# Patient Record
Sex: Female | Born: 1975 | Race: Black or African American | Hispanic: No | Marital: Single | Smoking: Former smoker
Health system: Southern US, Community
[De-identification: ages and names within clinical notes are randomized; demographics above are authoritative.]

## PROBLEM LIST (undated history)

## (undated) DIAGNOSIS — E785 Hyperlipidemia, unspecified: Secondary | ICD-10-CM

## (undated) DIAGNOSIS — E559 Vitamin D deficiency, unspecified: Secondary | ICD-10-CM

## (undated) DIAGNOSIS — I10 Essential (primary) hypertension: Secondary | ICD-10-CM

## (undated) DIAGNOSIS — R51 Headache: Secondary | ICD-10-CM

## (undated) DIAGNOSIS — E119 Type 2 diabetes mellitus without complications: Secondary | ICD-10-CM

## (undated) DIAGNOSIS — G4730 Sleep apnea, unspecified: Secondary | ICD-10-CM

## (undated) DIAGNOSIS — E282 Polycystic ovarian syndrome: Secondary | ICD-10-CM

## (undated) DIAGNOSIS — R519 Headache, unspecified: Secondary | ICD-10-CM

## (undated) HISTORY — DX: Sleep apnea, unspecified: G47.30

## (undated) HISTORY — DX: Headache: R51

## (undated) HISTORY — DX: Type 2 diabetes mellitus without complications: E11.9

## (undated) HISTORY — DX: Headache, unspecified: R51.9

## (undated) HISTORY — PX: WISDOM TOOTH EXTRACTION: SHX21

## (undated) HISTORY — DX: Hyperlipidemia, unspecified: E78.5

## (undated) HISTORY — PX: DILATION AND CURETTAGE OF UTERUS: SHX78

---

## 1999-10-15 ENCOUNTER — Emergency Department (HOSPITAL_COMMUNITY): Admission: EM | Admit: 1999-10-15 | Discharge: 1999-10-15 | Payer: Self-pay | Admitting: Emergency Medicine

## 1999-10-15 ENCOUNTER — Encounter: Payer: Self-pay | Admitting: Emergency Medicine

## 2002-08-02 ENCOUNTER — Encounter: Payer: Self-pay | Admitting: Emergency Medicine

## 2002-08-02 ENCOUNTER — Emergency Department (HOSPITAL_COMMUNITY): Admission: EM | Admit: 2002-08-02 | Discharge: 2002-08-02 | Payer: Self-pay | Admitting: Emergency Medicine

## 2002-12-30 ENCOUNTER — Other Ambulatory Visit: Admission: RE | Admit: 2002-12-30 | Discharge: 2002-12-30 | Payer: Self-pay | Admitting: Obstetrics and Gynecology

## 2004-03-22 ENCOUNTER — Emergency Department (HOSPITAL_COMMUNITY): Admission: EM | Admit: 2004-03-22 | Discharge: 2004-03-23 | Payer: Self-pay | Admitting: Emergency Medicine

## 2004-05-16 ENCOUNTER — Emergency Department (HOSPITAL_COMMUNITY): Admission: EM | Admit: 2004-05-16 | Discharge: 2004-05-16 | Payer: Self-pay | Admitting: Emergency Medicine

## 2004-05-17 ENCOUNTER — Ambulatory Visit (HOSPITAL_COMMUNITY): Admission: RE | Admit: 2004-05-17 | Discharge: 2004-05-17 | Payer: Self-pay | Admitting: Emergency Medicine

## 2005-01-15 ENCOUNTER — Ambulatory Visit (HOSPITAL_COMMUNITY): Admission: RE | Admit: 2005-01-15 | Discharge: 2005-01-15 | Payer: Self-pay | Admitting: Gastroenterology

## 2005-04-09 ENCOUNTER — Other Ambulatory Visit: Admission: RE | Admit: 2005-04-09 | Discharge: 2005-04-09 | Payer: Self-pay | Admitting: Family Medicine

## 2006-11-13 ENCOUNTER — Other Ambulatory Visit: Admission: RE | Admit: 2006-11-13 | Discharge: 2006-11-13 | Payer: Self-pay | Admitting: Family Medicine

## 2007-08-16 ENCOUNTER — Emergency Department (HOSPITAL_COMMUNITY): Admission: EM | Admit: 2007-08-16 | Discharge: 2007-08-16 | Payer: Self-pay | Admitting: Emergency Medicine

## 2011-04-11 ENCOUNTER — Ambulatory Visit (INDEPENDENT_AMBULATORY_CARE_PROVIDER_SITE_OTHER): Payer: BC Managed Care – PPO | Admitting: Family Medicine

## 2011-04-11 VITALS — BP 167/104 | HR 109 | Temp 98.2°F | Resp 16 | Ht 66.0 in | Wt 186.0 lb

## 2011-04-11 DIAGNOSIS — R202 Paresthesia of skin: Secondary | ICD-10-CM

## 2011-04-11 DIAGNOSIS — R000 Tachycardia, unspecified: Secondary | ICD-10-CM

## 2011-04-11 DIAGNOSIS — G252 Other specified forms of tremor: Secondary | ICD-10-CM

## 2011-04-11 DIAGNOSIS — R209 Unspecified disturbances of skin sensation: Secondary | ICD-10-CM

## 2011-04-11 DIAGNOSIS — R200 Anesthesia of skin: Secondary | ICD-10-CM

## 2011-04-11 DIAGNOSIS — R631 Polydipsia: Secondary | ICD-10-CM

## 2011-04-11 DIAGNOSIS — Z131 Encounter for screening for diabetes mellitus: Secondary | ICD-10-CM

## 2011-04-11 DIAGNOSIS — R259 Unspecified abnormal involuntary movements: Secondary | ICD-10-CM

## 2011-04-11 DIAGNOSIS — I10 Essential (primary) hypertension: Secondary | ICD-10-CM

## 2011-04-11 LAB — POCT CBC
Granulocyte percent: 64.2 %G (ref 37–80)
HCT, POC: 39 % (ref 37.7–47.9)
Hemoglobin: 12 g/dL — AB (ref 12.2–16.2)
Lymph, poc: 3 (ref 0.6–3.4)
MCH, POC: 25.9 pg — AB (ref 27–31.2)
MCHC: 30.8 g/dL — AB (ref 31.8–35.4)
MCV: 84.1 fL (ref 80–97)
MID (cbc): 0.5 (ref 0–0.9)
MPV: 8.9 fL (ref 0–99.8)
POC Granulocyte: 6.3 (ref 2–6.9)
POC LYMPH PERCENT: 30.4 %L (ref 10–50)
POC MID %: 5.4 %M (ref 0–12)
Platelet Count, POC: 432 10*3/uL — AB (ref 142–424)
RBC: 4.64 M/uL (ref 4.04–5.48)
RDW, POC: 14.7 %
WBC: 9.8 10*3/uL (ref 4.6–10.2)

## 2011-04-11 LAB — POCT GLYCOSYLATED HEMOGLOBIN (HGB A1C): Hemoglobin A1C: 5.3

## 2011-04-11 MED ORDER — HYDROCHLOROTHIAZIDE 25 MG PO TABS: 12.5000 mg | ORAL_TABLET | Freq: Every day | ORAL | Status: DC

## 2011-04-11 NOTE — Progress Notes (Signed)
[facility]:  Office Visit  Chief Complaint:  Chief Complaint  Patient presents with  . Night Sweats    x 3 weeks  . Tremors    since last night  . Numbness    R side of body  . Hypertension    HPI: Linda Glenn is a 36 y.o. female who complains of  3 week hx of numbness and tingling in right arm and leg, intermittent episodes lasting minutes to hours, worse at night and first thing in the morning, however in last 2-3 days more persistent. She has been lightheaded and nauseated for 1 week. She has had tremors and sweaty chills.  LMP 04/06/11. No CP, palpitations, SOB, vision changes, unintentional weight loss except for chalazion on left eye. She has a h/o migraines. Patient denies any illicit drug use except for recreational marijuana, no stimulants for weight loss or energy drinks. She does consume caffeine but not in large amount. Denies any recent travels or sick contacts or immunosupression or a history of anxiety/depression.  Past Medical History  Diagnosis Date  . Hyperlipidemia    History reviewed. No pertinent past surgical history.  Family History  Problem Relation Age of Onset  . Thyroid disease Mother   . Heart disease Mother    Allergies no known allergies Prior to Admission medications   Medication Sig Start Date End Date Taking? Authorizing Provider  doxycycline (DORYX) 100 MG DR capsule Take 100 mg by mouth 2 (two) times daily.   Yes Historical Provider, MD  tobramycin-dexamethasone [facility]) ophthalmic ointment 1 application every 4 (four) hours.   Yes Historical Provider, MD     ROS: The patient denies fevers, chills, night sweats, unintentional weight loss, chest pain, palpitations, wheezing, dyspnea on exertion, nausea, vomiting, abdominal pain, dysuria, hematuria, melena, or weakness, + numbness and  tingling.   All other systems have been reviewed and were otherwise negative with the exception of those mentioned in the HPI and as  above.    PHYSICAL EXAM: Filed Vitals:   04/11/11 1743  BP: 167/104  Pulse: 109  Temp: 98.2 F (36.8 C)  Resp: 16   Filed Vitals:   04/11/11 1743  Height: 5\' 6"  (1.676 m)  Weight: 186 lb (84.369 kg)   Body mass index is 30.02 kg/(m^2).  General: Alert, no acute distress, overweight, anxious HEENT:  Normocephalic, atraumatic, oropharynx patent.PERRLA, EOMI, fundoscopic exam normal Cardiovascular:  Regular rate and rhythm, no rubs murmurs or gallops.  No Carotid bruits, radial pulse intact. No pedal edema.  Respiratory: Clear to auscultation bilaterally.  No wheezes, rales, or rhonchi.  No cyanosis, no use of accessory musculature GI: No organomegaly, abdomen is soft and non-tender, positive bowel sounds.  No masses. Skin: No rashes. Neurologic: Facial musculature symmetric. CN 2-12 grossly intact Psychiatric: Patient is appropriate throughout our interaction. Lymphatic: No cervical lymphadenopathy Musculoskeletal: Gait intact.  LABS: Results for orders placed in visit on 04/11/11  POCT CBC      Component Value Range   WBC 9.8  4.6 - 10.2 (K/uL)   Lymph, poc 3.0  0.6 - 3.4    POC LYMPH PERCENT 30.4  10 - 50 (%L)   MID (cbc) 0.5  0 - 0.9    POC MID % 5.4  0 - 12 (%M)   POC Granulocyte 6.3  2 - 6.9    Granulocyte percent 64.2  37 - 80 (%G)   RBC 4.64  4.04 - 5.48 (M/uL)   Hemoglobin 12.0 (*)  12.2 - 16.2 (g/dL)   HCT, POC 16.1  09.6 - 47.9 (%)   MCV 84.1  80 - 97 (fL)   MCH, POC 25.9 (*) 27 - 31.2 (pg)   MCHC 30.8 (*) 31.8 - 35.4 (g/dL)   RDW, POC 04.5     Platelet Count, POC 432 (*) 142 - 424 (K/uL)   MPV 8.9  0 - 99.8 (fL)  POCT GLYCOSYLATED HEMOGLOBIN (HGB A1C)      Component Value Range   Hemoglobin A1C 5.3       EKG/XRAY:   Primary read interpreted by Dr. Conley Rolls at [facility]. EKG SR at 64 BPM, nonspecific ST changes without elevation, depression  Drug Screen normal   ASSESSMENT/PLAN: Encounter Diagnoses  Name Primary?  . Coarse tremors Yes  . Numbness and  tingling of right arm and leg   . Tachycardia - pulse   . Polydipsia   . Screening for diabetes mellitus    1. ? Etiology of multiple syndromes. EKG, drug screen, CBC, and HbA1c were all within normal . Patient's EKG was NSR at 64 bpm after she came in and sat down in the room. Pending labs, questionable thyroid issue. Floate, Vit B12 and CMP pending.. 2. HTN-will rx HCTZ 12.5 mg daily, monitor BP  F/u in 1 week or sooner as needed if sxs persist or worsen. If CP/SOB  Advise to go to ER.   Rockne Coons, DO 04/11/2011 6:53 PM

## 2011-04-12 LAB — COMPREHENSIVE METABOLIC PANEL
ALT: 17 U/L (ref 0–35)
AST: 17 U/L (ref 0–37)
Albumin: 4.9 g/dL (ref 3.5–5.2)
Alkaline Phosphatase: 55 U/L (ref 39–117)
BUN: 16 mg/dL (ref 6–23)
CO2: 26 mEq/L (ref 19–32)
Calcium: 10.1 mg/dL (ref 8.4–10.5)
Chloride: 102 mEq/L (ref 96–112)
Creat: 0.86 mg/dL (ref 0.50–1.10)
Glucose, Bld: 86 mg/dL (ref 70–99)
Potassium: 4.1 mEq/L (ref 3.5–5.3)
Sodium: 139 mEq/L (ref 135–145)
Total Bilirubin: 0.3 mg/dL (ref 0.3–1.2)
Total Protein: 7.7 g/dL (ref 6.0–8.3)

## 2011-04-12 LAB — TSH: TSH: 4.123 u[IU]/mL (ref 0.350–4.500)

## 2011-04-12 LAB — FOLATE: Folate: 14.5 ng/mL

## 2011-04-12 LAB — VITAMIN B12: Vitamin B-12: 503 pg/mL (ref 211–911)

## 2011-04-14 ENCOUNTER — Telehealth: Payer: Self-pay

## 2011-04-14 NOTE — Telephone Encounter (Signed)
.  [facility] PT STATES SHE WAS SEEN BY DR LEE AND WAS TOLD SHE WOULD GET A CALL THE PAST WEEKEND REGARDING HER LABS PLEASE CALL [phone]

## 2011-04-15 ENCOUNTER — Telehealth: Payer: Self-pay | Admitting: Family Medicine

## 2011-04-15 ENCOUNTER — Telehealth: Payer: Self-pay

## 2011-04-15 NOTE — Telephone Encounter (Signed)
The below telephone message should sate "no" evidence of diabetes

## 2011-04-15 NOTE — Telephone Encounter (Signed)
Left message at work as directed by note from staff regarding patient labs, all normal except for slightly marginal decrease in Hgb which is nothing to be concerned about. If she still has symptoms then need to follow-up with PCP and or myself in office.   Thyroid, electrolytes, vitamin B12, folate, liver, kidneys all normal. Evidence of diabetes. Need to know if she is still having sxs and if they are worse, same, better than when she came to see me .

## 2011-04-15 NOTE — Telephone Encounter (Signed)
Pt called back to update Dr Conley Rolls on Sxs. She still has the tingling on right side of body and the night sweats, but her dizziness has improved since she went on the BP med. She reports her BP was 130/95 w/ P 99 on Sunday, and 128/90 w/ P78 on Monday. Please advise as to next step for pt to find out what causing Sxs. Does she need to RTC here since she doesn't have a PCP, are there any more tests to check for hormone imbalance or anything else? If you can call her back she will be at [phone] until 6:00 tonight

## 2011-04-18 ENCOUNTER — Telehealth: Payer: Self-pay | Admitting: Family Medicine

## 2011-04-18 NOTE — Telephone Encounter (Signed)
Spoke with patient regarding labs, also f/u if still having back and arm pain, and night sweats. She is not having weightloss. She has PCOS and will go to her Ob/Gyn for annual and may consider asking him about night sweats.

## 2011-04-23 NOTE — Telephone Encounter (Signed)
Dr Conley Rolls called pt back on 04/18/11. See notes on that encounter

## 2012-02-06 ENCOUNTER — Encounter (HOSPITAL_COMMUNITY): Payer: Self-pay | Admitting: *Deleted

## 2012-02-06 ENCOUNTER — Emergency Department (HOSPITAL_COMMUNITY)
Admission: EM | Admit: 2012-02-06 | Discharge: 2012-02-06 | Disposition: A | Discharge status: Home / Self Care | Payer: BC Managed Care – PPO | Attending: Emergency Medicine | Admitting: Emergency Medicine

## 2012-02-06 DIAGNOSIS — E282 Polycystic ovarian syndrome: Secondary | ICD-10-CM | POA: Insufficient documentation

## 2012-02-06 DIAGNOSIS — O99330 Smoking (tobacco) complicating pregnancy, unspecified trimester: Secondary | ICD-10-CM | POA: Insufficient documentation

## 2012-02-06 DIAGNOSIS — E785 Hyperlipidemia, unspecified: Secondary | ICD-10-CM | POA: Insufficient documentation

## 2012-02-06 DIAGNOSIS — I10 Essential (primary) hypertension: Secondary | ICD-10-CM | POA: Insufficient documentation

## 2012-02-06 DIAGNOSIS — R55 Syncope and collapse: Secondary | ICD-10-CM | POA: Insufficient documentation

## 2012-02-06 DIAGNOSIS — O9989 Other specified diseases and conditions complicating pregnancy, childbirth and the puerperium: Secondary | ICD-10-CM | POA: Insufficient documentation

## 2012-02-06 DIAGNOSIS — R0602 Shortness of breath: Secondary | ICD-10-CM | POA: Insufficient documentation

## 2012-02-06 DIAGNOSIS — Z3490 Encounter for supervision of normal pregnancy, unspecified, unspecified trimester: Secondary | ICD-10-CM

## 2012-02-06 HISTORY — DX: Vitamin D deficiency, unspecified: E55.9

## 2012-02-06 HISTORY — DX: Essential (primary) hypertension: I10

## 2012-02-06 HISTORY — DX: Polycystic ovarian syndrome: E28.2

## 2012-02-06 LAB — URINALYSIS, ROUTINE W REFLEX MICROSCOPIC
Nitrite: NEGATIVE
Specific Gravity, Urine: 1.02 (ref 1.005–1.030)
Urobilinogen, UA: 0.2 mg/dL (ref 0.0–1.0)
pH: 7.5 (ref 5.0–8.0)

## 2012-02-06 LAB — BASIC METABOLIC PANEL
BUN: 10 mg/dL (ref 6–23)
CO2: 24 mEq/L (ref 19–32)
Chloride: 102 mEq/L (ref 96–112)
Creatinine, Ser: 0.66 mg/dL (ref 0.50–1.10)
GFR calc Af Amer: >90 mL/min (ref >90)
Potassium: 3.4 mEq/L — ABNORMAL LOW (ref 3.5–5.1)

## 2012-02-06 LAB — URINE MICROSCOPIC-ADD ON

## 2012-02-06 LAB — CBC
HCT: 35.1 % — ABNORMAL LOW (ref 36.0–46.0)
MCV: 82 fL (ref 78.0–100.0)
Platelets: 316 10*3/uL (ref 150–400)
RBC: 4.28 MIL/uL (ref 3.87–5.11)
RDW: 14.4 % (ref 11.5–15.5)
WBC: 9.3 10*3/uL (ref 4.0–10.5)

## 2012-02-06 LAB — PRO B NATRIURETIC PEPTIDE: Pro B Natriuretic peptide (BNP): 24.6 pg/mL (ref 0–125)

## 2012-02-06 LAB — GLUCOSE, CAPILLARY: Glucose-Capillary: 89 mg/dL (ref 70–99)

## 2012-02-06 LAB — POCT PREGNANCY, URINE: Preg Test, Ur: POSITIVE — AB

## 2012-02-06 MED ORDER — PRENATAL VITAMINS (DIS) PO TABS: 1.0000 | ORAL_TABLET | Freq: Once | ORAL | Status: DC

## 2012-02-06 MED ORDER — ONDANSETRON HCL 4 MG PO TABS: 4.0000 mg | ORAL_TABLET | Freq: Four times a day (QID) | ORAL | Status: DC | PRN

## 2012-02-06 NOTE — ED Notes (Signed)
CBG was 89 

## 2012-02-06 NOTE — ED Provider Notes (Signed)
History     CSN: [id number]  Arrival date & time 02/06/12  1144   First MD Initiated Contact with Patient 02/06/12 1155      Chief Complaint  Patient presents with  . Shortness of Breath  . Near Syncope  . Breast Discharge    (Consider location/radiation/quality/duration/timing/severity/associated sxs/prior treatment) HPI  Patient reports 2 weeks ago she started feeling lethargic and felt like she couldn't drink enough. She states she's thirsty all the time and she is urinating a lot. She states sometimes she feels a "bubble" underneath her chest that lasted for a few seconds and then becomes a crampy sensation. She states she's also having nocturia but not having weight loss. Patient states she is P1 P0 Ab1 (36 yo). She states she's followed by Dr. Jackelyn Knife for polycystic ovary syndrome. She is also very concerned that her last period was 67 days ago and she normally doesn't go over 60 days. She states she's had 2 home pregnancy tests that were negative. She also complains of her breast being sore and having clear drainage from her nipples the last couple days.  PCP urgent care on [address] OB/GYN Dr. Jackelyn Knife at [facility]  Past Medical History  Diagnosis Date  . Hyperlipidemia   . Hypertension   . Vitamin D deficiency   . Polycystic ovarian syndrome     History reviewed. No pertinent past surgical history.  Family History  Problem Relation Age of Onset  . Thyroid disease Mother   . Heart disease Mother     History  Substance Use Topics  . Smoking status: Current Every Day Smoker -- 0.3 packs/day for 10 years    Types: Cigarettes  . Smokeless tobacco: Not on file  . Alcohol Use: Yes  employed  OB History    Grav Para Term Preterm Abortions TAB SAB Ect Mult Living                  Review of Systems  All other systems reviewed and are negative.    Allergies  Review of patient's allergies indicates no known allergies.  Home Medications      BP 133/78  Pulse 87  Temp 97.9 F (36.6 C) (Oral)  Resp 24  SpO2 100%  LMP 10/24/2011  Vital signs normal    Physical Exam  Nursing note and vitals reviewed. Constitutional: She is oriented to person, place, and time. She appears well-developed and well-nourished.  Non-toxic appearance. She does not appear ill. No distress.  HENT:  Head: Normocephalic and atraumatic.  Right Ear: External ear normal.  Left Ear: External ear normal.  Nose: Nose normal. No mucosal edema or rhinorrhea.  Mouth/Throat: Oropharynx is clear and moist and mucous membranes are normal. No dental abscesses or uvula swelling.  Eyes: Conjunctivae normal and EOM are normal. Pupils are equal, round, and reactive to light.  Neck: Normal range of motion and full passive range of motion without pain. Neck supple.  Cardiovascular: Normal rate, regular rhythm and normal heart sounds.  Exam reveals no gallop and no friction rub.   No murmur heard. Pulmonary/Chest: Effort normal and breath sounds normal. No respiratory distress. She has no wheezes. She has no rhonchi. She has no rales. She exhibits no tenderness and no crepitus.  Abdominal: Soft. Normal appearance and bowel sounds are normal. She exhibits no distension. There is no tenderness. There is no rebound and no guarding.  Musculoskeletal: Normal range of motion. She exhibits no edema and no tenderness.  Moves all extremities well.   Neurological: She is alert and oriented to person, place, and time. She has normal strength. No cranial nerve deficit.  Skin: Skin is warm, dry and intact. No rash noted. No erythema. No pallor.  Psychiatric: Her speech is normal and behavior is normal. Her mood appears not anxious.       Seems anxious    ED Course  Procedures (including critical care time)  When patient was informed she was pregnant she started crying because she was told she would have difficulty getting pregnant with her polycystic ovary  syndrome.  At time of discharge patient seems very happy.  Results for orders placed during the hospital encounter of 02/06/12  CBC      Component Value Range   WBC 9.3  4.0 - 10.5 K/uL   RBC 4.28  3.87 - 5.11 MIL/uL   Hemoglobin 11.3 (*) 12.0 - 15.0 g/dL   HCT 95.6 (*) 21.3 - 08.6 %   MCV 82.0  78.0 - 100.0 fL   MCH 26.4  26.0 - 34.0 pg   MCHC 32.2  30.0 - 36.0 g/dL   RDW 57.8  46.9 - 62.9 %   Platelets 316  150 - 400 K/uL  BASIC METABOLIC PANEL      Component Value Range   Sodium 136  135 - 145 mEq/L   Potassium 3.4 (*) 3.5 - 5.1 mEq/L   Chloride 102  96 - 112 mEq/L   CO2 24  19 - 32 mEq/L   Glucose, Bld 102 (*) 70 - 99 mg/dL   BUN 10  6 - 23 mg/dL   Creatinine, Ser 5.28  0.50 - 1.10 mg/dL   Calcium 9.6  8.4 - 41.3 mg/dL   GFR calc non Af Amer >90  >90 mL/min   GFR calc Af Amer >90  >90 mL/min  PRO B NATRIURETIC PEPTIDE      Component Value Range   Pro B Natriuretic peptide (BNP) 24.6  0 - 125 pg/mL  URINALYSIS, ROUTINE W REFLEX MICROSCOPIC      Component Value Range   Color, Urine YELLOW  YELLOW   APPearance CLOUDY (*) CLEAR   Specific Gravity, Urine 1.020  1.005 - 1.030   pH 7.5  5.0 - 8.0   Glucose, UA NEGATIVE  NEGATIVE mg/dL   Hgb urine dipstick NEGATIVE  NEGATIVE   Bilirubin Urine NEGATIVE  NEGATIVE   Ketones, ur NEGATIVE  NEGATIVE mg/dL   Protein, ur NEGATIVE  NEGATIVE mg/dL   Urobilinogen, UA 0.2  0.0 - 1.0 mg/dL   Nitrite NEGATIVE  NEGATIVE   Leukocytes, UA SMALL (*) NEGATIVE  POCT PREGNANCY, URINE      Component Value Range   Preg Test, Ur POSITIVE (*) NEGATIVE  GLUCOSE, CAPILLARY      Component Value Range   Glucose-Capillary 89  70 - 99 mg/dL   Comment 1 Notify RN    POCT I-STAT TROPONIN I      Component Value Range   Troponin i, poc 0.01  0.00 - 0.08 ng/mL   Comment 3           URINE MICROSCOPIC-ADD ON      Component Value Range   Squamous Epithelial / LPF MANY (*) RARE   WBC, UA 3-6  <3 WBC/hpf   Bacteria, UA FEW (*) RARE   Laboratory  interpretation all normal except contaminated UA, + pregnancy, mild anemia and minimal hypokalemia    Date: 02/06/2012  Rate: 87  Rhythm:  normal sinus rhythm  QRS Axis: normal  Intervals: normal  ST/T Wave abnormalities: normal  Conduction Disutrbances:none  Narrative Interpretation:   Old EKG Reviewed: none available    1. Pregnancy     New Prescriptions   ONDANSETRON (ZOFRAN) 4 MG TABLET    Take 1 tablet (4 mg total) by mouth every 6 (six) hours as needed for nausea.   PRENATAL VITAMINS (DIS) TABS    Take 1 tablet by mouth once.    Plan discharge  Devoria Albe, MD, FACEP   MDM          Ward Givens, MD 02/06/12 [phone]

## 2012-02-06 NOTE — ED Notes (Signed)
Pt here from home, c/o sob, near syncope, breast tenderness and discharge, bilat x 2 weeks. Symptoms have become significantly worse over past few days. Reports taking home pregnancy test, was negative

## 2012-02-07 LAB — URINE CULTURE: Colony Count: 35000

## 2012-02-23 ENCOUNTER — Inpatient Hospital Stay (HOSPITAL_COMMUNITY)
Admission: AD | Admit: 2012-02-23 | Discharge: 2012-02-23 | Disposition: A | Discharge status: Home / Self Care | Payer: BC Managed Care – PPO | Source: Ambulatory Visit | Attending: Obstetrics & Gynecology | Admitting: Obstetrics & Gynecology

## 2012-02-23 ENCOUNTER — Encounter (HOSPITAL_COMMUNITY): Payer: Self-pay

## 2012-02-23 ENCOUNTER — Inpatient Hospital Stay (HOSPITAL_COMMUNITY): Payer: BC Managed Care – PPO

## 2012-02-23 DIAGNOSIS — O039 Complete or unspecified spontaneous abortion without complication: Secondary | ICD-10-CM

## 2012-02-23 DIAGNOSIS — R109 Unspecified abdominal pain: Secondary | ICD-10-CM | POA: Insufficient documentation

## 2012-02-23 DIAGNOSIS — O200 Threatened abortion: Secondary | ICD-10-CM | POA: Insufficient documentation

## 2012-02-23 LAB — CBC WITH DIFFERENTIAL/PLATELET
Basophils Absolute: 0 10*3/uL (ref 0.0–0.1)
Basophils Relative: 0 % (ref 0–1)
Hemoglobin: 11.1 g/dL — ABNORMAL LOW (ref 12.0–15.0)
Lymphocytes Relative: 22 % (ref 12–46)
MCHC: 32.1 g/dL (ref 30.0–36.0)
Monocytes Relative: 7 % (ref 3–12)
Neutro Abs: 6.9 10*3/uL (ref 1.7–7.7)
Neutrophils Relative %: 69 % (ref 43–77)
RBC: 4.12 MIL/uL (ref 3.87–5.11)
WBC: 10 10*3/uL (ref 4.0–10.5)

## 2012-02-23 LAB — WET PREP, GENITAL: Clue Cells Wet Prep HPF POC: NONE SEEN

## 2012-02-23 LAB — URINALYSIS, ROUTINE W REFLEX MICROSCOPIC
Bilirubin Urine: NEGATIVE
Ketones, ur: NEGATIVE mg/dL
Protein, ur: NEGATIVE mg/dL
Urobilinogen, UA: 0.2 mg/dL (ref 0.0–1.0)

## 2012-02-23 LAB — ABO/RH: ABO/RH(D): O POS

## 2012-02-23 MED ORDER — LACTATED RINGERS IV SOLN
INTRAVENOUS | Status: DC
  Administered 2012-02-23: 13:00:00 via INTRAVENOUS

## 2012-02-23 MED ORDER — ONDANSETRON HCL 4 MG/2ML IJ SOLN
4.0000 mg | INTRAMUSCULAR | Status: AC
Start: 2012-02-23 — End: 2012-02-23
  Administered 2012-02-23: 4 mg via INTRAVENOUS
  Filled 2012-02-23: qty 2

## 2012-02-23 MED ORDER — HYDROMORPHONE HCL PF 1 MG/ML IJ SOLN
0.5000 mg | INTRAMUSCULAR | Status: AC
  Administered 2012-02-23: 0.5 mg via INTRAVENOUS
  Filled 2012-02-23: qty 1

## 2012-02-23 NOTE — MAU Provider Note (Signed)
History     CSN: [id number]  Arrival date and time: 02/23/12 0935   None     Chief Complaint  Patient presents with  . Abdominal Cramping  . Vaginal Bleeding   HPI Linda Glenn is a 37 y.o. female who presents to MAU with vaginal bleeding. LMP 10/24/11. Patient states she has PCOS and periods are irregular. Had positive pregnancy test at [facility] 02/06/12. Followed up with PCP and had Bhcg and told she was most likely 4 to [redacted] weeks gestation. The bleeding started 5 days ago. She describes the bleeding as small amount after having intercourse. She called her Family doctor and told just to watch. Continued to have light bleeding until yesterday when bleeding increased and today has become heavy with lower abdominal cramping. Using a pad every one and a half hours. She rates the cramping pain as 6/10. The history was provided by the patient.  OB History    Grav Para Term Preterm Abortions TAB SAB Ect Mult Living   2    1 1           Past Medical History  Diagnosis Date  . Hyperlipidemia   . Hypertension   . Vitamin D deficiency   . Polycystic ovarian syndrome     Past Surgical History  Procedure Date  . Wisdom tooth extraction   . Dilation and curettage of uterus     Family History  Problem Relation Age of Onset  . Thyroid disease Mother   . Heart disease Mother     History  Substance Use Topics  . Smoking status: Current Every Day Smoker -- 0.3 packs/day for 10 years    Types: Cigarettes  . Smokeless tobacco: Never Used  . Alcohol Use: No    Allergies: No Known Allergies  Prescriptions prior to admission  Medication Sig Dispense Refill  . acetaminophen (TYLENOL) 500 MG tablet Take 500 mg by mouth daily as needed. Taking for headache      . Prenatal Vit-Fe Fumarate-FA (PRENATAL MULTIVITAMIN) TABS Take 1 tablet by mouth daily.      . ondansetron (ZOFRAN) 4 MG tablet Take 1 tablet (4 mg total) by mouth every 6 (six) hours as needed for nausea.  12 tablet  0     Review of Systems  Constitutional: Negative for fever, chills and weight loss.  HENT: Negative for ear pain, nosebleeds, congestion, sore throat and neck pain.   Eyes: Negative for blurred vision, double vision, photophobia and pain.  Respiratory: Negative for cough, shortness of breath and wheezing.   Cardiovascular: Negative for chest pain, palpitations and leg swelling.  Gastrointestinal: Positive for nausea and abdominal pain. Negative for heartburn, vomiting, diarrhea and constipation.  Genitourinary: Positive for frequency. Negative for dysuria and urgency.  Musculoskeletal: Negative for myalgias and back pain.  Skin: Positive for rash (eczema). Negative for itching.  Neurological: Positive for headaches. Negative for dizziness, sensory change, speech change, seizures and weakness.  Endo/Heme/Allergies: Does not bruise/bleed easily.  Psychiatric/Behavioral: Negative for depression and substance abuse. The patient is not nervous/anxious and does not have insomnia.    Physical Exam   Blood pressure 134/84, pulse 93, temperature 99.2 F (37.3 C), temperature source Oral, resp. rate 16, height 5\' 3"  (1.6 m), weight 203 lb 3.2 oz (92.171 kg), last menstrual period 10/24/2011, SpO2 100.00%.  Physical Exam  Nursing note and vitals reviewed. Constitutional: She is oriented to person, place, and time. She appears well-developed and well-nourished. No distress.  HENT:  Head: Normocephalic and  atraumatic.  Eyes: EOM are normal.  Neck: Neck supple.  Cardiovascular: Normal rate.   Respiratory: Effort normal.  GI: Soft. There is no tenderness.  Genitourinary:       External genitalia without lesions. Moderate blood vaginal vault. Cervix open with tissue visualized in os. Positive CMT, no adnexal tenderness. Uterus slightly enlarged.  Musculoskeletal: Normal range of motion.  Neurological: She is alert and oriented to person, place, and time.  Skin: Skin is warm and dry.  Psychiatric:  She has a normal mood and affect. Her behavior is normal. Judgment and thought content normal.    MAU Course  Procedures  Assessment: 37 y.o. female @ 5.[redacted] weeks gestation with vaginal bleeding   SAB in progress  Plan:  Discussed with Dr. Marice Potter and she will evaluate patient in MAU  Patient with increased cramping and bleeding on return from ultrasound. IV of LR started and patient given Dilaudid 0.5 mg and Zofran 4 mg IV  Note: Dr. Marice Potter in to examine patient. Large amount of tissue in cervical os. Using ring forceps Dr. Marice Potter removed tissue. Cramping and bleeding decreased.  Tissue sent to pathology lab Will observe bleeding for the next hour and if patient continues to improve will d/c home to follow up in the GYN Clinic or with GYN of choice.   @ 14:00 patient feeling better, decreased bleeding, no pain.  I have reviewed this patient's vital signs, nurses notes, appropriate labs and imaging. I have discussed results in detail with the patient and plan of care. Patient voices understanding.   Medication List     As of 02/23/2012  2:09 PM    CONTINUE taking these medications         acetaminophen 500 MG tablet   Commonly known as: TYLENOL      ondansetron 4 MG tablet   Commonly known as: ZOFRAN   Take 1 tablet (4 mg total) by mouth every 6 (six) hours as needed for nausea.      prenatal multivitamin Tabs          Azlyn Wingler, RN, FNP, [facility] 02/23/2012, 10:44 AM

## 2012-02-23 NOTE — MAU Note (Signed)
Specimen sent to pathology per [facility], NP

## 2012-02-23 NOTE — MAU Note (Signed)
Pt given comfort care packet, info reviewed. Questions answered.

## 2012-02-23 NOTE — MAU Note (Signed)
Patient states she started having spotting on 1-1 and was light for several days, has gotten heavier and yesterday started like a period and heavy today with clots with cramping today.

## 2012-02-23 NOTE — MAU Note (Signed)
Pt's bleeding slowed, no clots noted.

## 2012-02-24 LAB — URINE CULTURE: Colony Count: 70000

## 2012-02-24 LAB — GC/CHLAMYDIA PROBE AMP: GC Probe RNA: NEGATIVE

## 2012-03-10 ENCOUNTER — Encounter: Payer: BC Managed Care – PPO | Admitting: Obstetrics & Gynecology

## 2012-09-03 ENCOUNTER — Other Ambulatory Visit: Payer: Self-pay | Admitting: Obstetrics and Gynecology

## 2012-09-03 ENCOUNTER — Other Ambulatory Visit (HOSPITAL_COMMUNITY)
Admission: RE | Admit: 2012-09-03 | Discharge: 2012-09-03 | Disposition: A | Discharge status: Home / Self Care | Payer: BC Managed Care – PPO | Source: Ambulatory Visit | Attending: Obstetrics and Gynecology | Admitting: Obstetrics and Gynecology

## 2012-09-03 DIAGNOSIS — Z1151 Encounter for screening for human papillomavirus (HPV): Secondary | ICD-10-CM | POA: Insufficient documentation

## 2012-09-03 DIAGNOSIS — Z01419 Encounter for gynecological examination (general) (routine) without abnormal findings: Secondary | ICD-10-CM | POA: Insufficient documentation

## 2012-12-28 ENCOUNTER — Encounter (HOSPITAL_COMMUNITY): Payer: Self-pay | Admitting: *Deleted

## 2013-03-23 ENCOUNTER — Other Ambulatory Visit: Payer: Self-pay | Admitting: Obstetrics and Gynecology

## 2013-10-25 ENCOUNTER — Encounter: Payer: BC Managed Care – PPO | Attending: Obstetrics & Gynecology

## 2013-10-25 VITALS — Ht 63.0 in | Wt 203.9 lb

## 2013-10-25 DIAGNOSIS — Z713 Dietary counseling and surveillance: Secondary | ICD-10-CM | POA: Insufficient documentation

## 2013-10-25 DIAGNOSIS — E119 Type 2 diabetes mellitus without complications: Secondary | ICD-10-CM | POA: Diagnosis present

## 2013-10-27 NOTE — Progress Notes (Signed)
Patient was seen on 10-25-13 for the first of a series of three diabetes self-management courses at [facility].  Patient Education Plan per assessed needs and concerns is to attend four course education program for Diabetes Self Management Education.  Current HbA1c: 6.0%  The following learning objectives were met by the patient during this class:  Describe diabetes  State some common risk factors for diabetes  Defines the role of glucose and insulin  Identifies type of diabetes and pathophysiology  Describe the relationship between diabetes and cardiovascular risk  State the members of the Healthcare Team  States the rationale for glucose monitoring  State when to test glucose  State their individual Target Range  State the importance of logging glucose readings  Describe how to interpret glucose readings  Identifies A1C target  Explain the correlation between A1c and eAG values  State symptoms and treatment of high blood glucose  State symptoms and treatment of low blood glucose  Explain proper technique for glucose testing  Identifies proper sharps disposal Handouts given during class include:  Living Well with Diabetes book  Carb Counting and Meal Planning book  Meal Plan Card  Carbohydrate guide  Meal planning worksheet  Low Sodium Flavoring Tips  The diabetes portion plate  A1c to eAG Conversion Chart  Diabetes Medications  Diabetes Recommended Care Schedule  Support Group  Diabetes Success Plan  Core Class Satisfaction Survey Follow-Up Plan:  Attend core 2    

## 2013-11-01 DIAGNOSIS — E119 Type 2 diabetes mellitus without complications: Secondary | ICD-10-CM

## 2013-11-01 NOTE — Progress Notes (Signed)

## 2013-11-08 DIAGNOSIS — E119 Type 2 diabetes mellitus without complications: Secondary | ICD-10-CM | POA: Diagnosis not present

## 2013-11-09 NOTE — Progress Notes (Signed)
Patient was seen on 11/08/2013 for the third of a series of three diabetes self-management courses at the [facility]. The following learning objectives were met by the patient during this class:    State the amount of activity recommended for healthy living   Describe activities suitable for individual needs   Identify ways to regularly incorporate activity into daily life   Identify barriers to activity and ways to over come these barriers  Identify diabetes medications being personally used and their primary action for lowering glucose and possible side effects   Describe role of stress on blood glucose and develop strategies to address psychosocial issues   Identify diabetes complications and ways to prevent them  Explain how to manage diabetes during illness   Evaluate success in meeting personal goal   Establish 2-3 goals that they will plan to diligently work on until they return for the  41-monthfollow-up visit  Goals:   I will count my carb choices at most meals and snacks  I will be active 30 minutes or more 3-5 times a week  I will take my diabetes medications as scheduled  Your patient has identified these potential barriers to change:  Motivation Stress  Your patient has identified their diabetes self-care support plan as  Family Support Plan:  Attend Core 4 in 4 months

## 2013-11-10 ENCOUNTER — Ambulatory Visit: Payer: BC Managed Care – PPO | Admitting: Dietician

## 2014-02-20 ENCOUNTER — Ambulatory Visit: Payer: BC Managed Care – PPO

## 2015-08-23 ENCOUNTER — Ambulatory Visit (INDEPENDENT_AMBULATORY_CARE_PROVIDER_SITE_OTHER): Payer: BLUE CROSS/BLUE SHIELD | Admitting: Neurology

## 2015-08-23 ENCOUNTER — Encounter: Payer: Self-pay | Admitting: Neurology

## 2015-08-23 VITALS — BP 128/64 | HR 80 | Resp 18 | Ht 66.0 in | Wt 204.0 lb

## 2015-08-23 DIAGNOSIS — R0683 Snoring: Secondary | ICD-10-CM | POA: Diagnosis not present

## 2015-08-23 DIAGNOSIS — G479 Sleep disorder, unspecified: Secondary | ICD-10-CM | POA: Diagnosis not present

## 2015-08-23 DIAGNOSIS — R351 Nocturia: Secondary | ICD-10-CM

## 2015-08-23 DIAGNOSIS — R51 Headache: Secondary | ICD-10-CM

## 2015-08-23 DIAGNOSIS — R0681 Apnea, not elsewhere classified: Secondary | ICD-10-CM | POA: Diagnosis not present

## 2015-08-23 DIAGNOSIS — G4710 Hypersomnia, unspecified: Secondary | ICD-10-CM

## 2015-08-23 DIAGNOSIS — R519 Headache, unspecified: Secondary | ICD-10-CM

## 2015-08-23 NOTE — Progress Notes (Signed)
Subjective:    Patient ID: Linda Glenn is a 40 y.o. female.  HPI     Linda FoleySaima Jalilah Wiltsie, MD, PhD [facility] [address], [address] [address] [place], [place] [id number]  Dear Linda Glenn,   I saw your patient, Linda Glenn, upon your kind request in my neurologic clinic today for initial consultation of her sleep disorder, in particular concern for underlying sleep disorder breathing. The patient is unaccompanied today. As you know, Linda Glenn is a 40 year old right-handed woman with an underlying medical history of hypertension, hyperlipidemia, PCOS, prediabetes, and obesity, who reports snoring and excessive daytime somnolence, as well as breathing pauses while asleep, per mother. She tried her mother's CPAP machine. She also reports numbness intermittently on her left side. I reviewed your office note from 07/13/2015, which you kindly included. She had recent blood work 3 office including vitamin D level, vitamin B12, CBC with differential, CMP, and TSH. Will request test results from your office. Hemoglobin A1c in your office was 6.2 at the time. Random fingerstick blood sugar was 92 the time.Per her verbal report, other blood test results were fine with the exception of low vitamin D. She has been taking prescription strength vitamin D. The patient is single and has no children. She works at [employer]. She drinks alcohol infrequently, smokes marijuana occasionally, drinks caffeine daily in the form of coffee one cup per day and 2 bottles of soda, 20 ounces each. She goes to bed around midnight. She usually sleeps at most 4 hours in a row. She has to get up to use bathroom once or twice per night. She denies restless leg symptoms but is a restless sleeper. She has no pets. She watches TV in bed and TV tends to stay on all night. She has occasional morning headaches. Her maternal grandfather also had obstructive sleep apnea. The patient has woken up with a sense of  gasping and panic. She has had intermittent tingling in her left forearm and left foot. Symptoms are mild and not particularly bothersome. She also has had chest wall discomfort on the left lateral chest wall and was told that she had most likely costochondritis.   of note, she saw ENT last week. She had a head CT, could be a maxillofacial CT, reports that CT scan was unremarkable. She was started on Singulair and Xyzal. She quit smoking some 3 years ago.  Her Past Medical History Is Significant For: Past Medical History  Diagnosis Date  . Hyperlipidemia   . Hypertension   . Vitamin D deficiency   . Polycystic ovarian syndrome   . Sleep apnea   . Diabetes mellitus without complication (HCC)   . Headache     Her Past Surgical History Is Significant For: Past Surgical History  Procedure Laterality Date  . Wisdom tooth extraction    . Dilation and curettage of uterus      Her Family History Is Significant For: Family History  Problem Relation Age of Onset  . Thyroid disease Mother   . Heart disease Mother   . Fibromyalgia Mother   . Sleep apnea Mother   . Stroke Mother   . Heart failure Mother   . Prostate cancer Father     Her Social History Is Significant For: Social History   Social History  . Marital Status: Single    Spouse Name: N/A  . Number of Children: 0  . Years of Education: college   Occupational History  . [employer]  Social History Main Topics  . Smoking status: Former Smoker -- 0.30 packs/day for 10 years    Types: Cigarettes  . Smokeless tobacco: Never Used     Comment: Quit 2013   . Alcohol Use: 0.0 oz/week    0 Standard drinks or equivalent per week  . Drug Use: Yes    Special: Marijuana  . Sexual Activity: Yes    Birth Control/ Protection: None   Other Topics Concern  . Not on file   Social History Narrative   Drinks caffeine daily     Her Allergies Are:  Allergies  Allergen Reactions  . Food     Tomato  :   Her Current  Medications Are:  Outpatient Encounter Prescriptions as of 08/23/2015  Medication Sig  . levocetirizine (XYZAL) 5 MG tablet   . metFORMIN (GLUCOPHAGE) 500 MG tablet Take 500 mg by mouth daily.  . montelukast (SINGULAIR) 10 MG tablet   . Vitamin D, Ergocalciferol, (DRISDOL) 50000 units CAPS capsule   . [DISCONTINUED] acetaminophen (TYLENOL) 500 MG tablet Take 500 mg by mouth daily as needed. Taking for headache  . [DISCONTINUED] lisinopril (PRINIVIL,ZESTRIL) 5 MG tablet Take 5 mg by mouth daily.  . [DISCONTINUED] ondansetron (ZOFRAN) 4 MG tablet Take 1 tablet (4 mg total) by mouth every 6 (six) hours as needed for nausea.  . [DISCONTINUED] Prenatal Vit-Fe Fumarate-FA (PRENATAL MULTIVITAMIN) TABS Take 1 tablet by mouth daily.  . [DISCONTINUED] simvastatin (ZOCOR) 20 MG tablet Take 20 mg by mouth daily.   No facility-administered encounter medications on file as of 08/23/2015.  :  Review of Systems:  Out of a complete 14 point review of systems, all are reviewed and negative with the exception of these symptoms as listed below:   Review of Systems  Neurological:       Sleeps about 4 hours a night. Patient has trouble falling asleep, has trouble staying asleep, snoring, witnessed apnea, mother has sleep apnea, wakes up gasping for air, wakes up feeling tired, headaches, denies taking naps, daytime tiredness.    Epworth Sleepiness Scale 0= would never doze 1= slight chance of dozing 2= moderate chance of dozing 3= high chance of dozing  Sitting and reading:1 Watching TV:1 Sitting inactive in a public place (ex. Theater or meeting):0 As a passenger in a car for an hour without a break:0 Lying down to rest in the afternoon:1 Sitting and talking to someone:2 Sitting quietly after lunch (no alcohol):0 In a car, while stopped in traffic:0 Total:5  Objective:  Neurologic Exam  Physical Exam Physical Examination:   Filed Vitals:   08/23/15 1344  BP: 128/64  Pulse: 80  Resp: 18     General Examination: The patient is a very pleasant 40 y.o. female in no acute distress. She appears well-developed and well-nourished and very well groomed.   HEENT: Normocephalic, atraumatic, pupils are equal, round and reactive to light and accommodation. Funduscopic exam is normal with sharp disc margins noted. Extraocular tracking is good without limitation to gaze excursion or nystagmus noted. Normal smooth pursuit is noted. Hearing is grossly intact. Tympanic membranes are clear bilaterally. Face is symmetric with normal facial animation and normal facial sensation. Speech is clear with no dysarthria noted. There is no hypophonia. There is no lip, neck/head, jaw or voice tremor. Neck is supple with full range of passive and active motion. There are no carotid bruits on auscultation. Oropharynx exam reveals: mild mouth dryness, good dental hygiene and moderate airway crowding, due to  small airway  entry, and tonsils in place which are 1+ bilaterally, tongue is normal in size, Mallampati is class II. Tongue protrudes centrally and palate elevates symmetrically. Neck size is 16 inches. She has a Mild overbite. Nasal inspection reveals mild nasal mucosal bogginess and inferior turbinate hypertrophy, right worse than left, no significant septal deviation is noted.    Chest: Clear to auscultation without wheezing, rhonchi or crackles noted.  Heart: S1+S2+0, regular and normal without murmurs, rubs or gallops noted.   Abdomen: Soft, non-tender and non-distended with normal bowel sounds appreciated on auscultation.  Extremities: There is no pitting edema in the distal lower extremities bilaterally. Pedal pulses are intact.  Skin: Warm and dry without trophic changes noted. There are no varicose veins.  Musculoskeletal: exam reveals no obvious joint deformities, tenderness or joint swelling or erythema.   Neurologically:  Mental status: The patient is awake, alert and oriented in all 4 spheres.  Her immediate and remote memory, attention, language skills and fund of knowledge are appropriate. There is no evidence of aphasia, agnosia, apraxia or anomia. Speech is clear with normal prosody and enunciation. Thought process is linear. Mood is normal and affect is normal.  Cranial nerves II - XII are as described above under HEENT exam. In addition: shoulder shrug is normal with equal shoulder height noted. Motor exam: Normal bulk, strength and tone is noted. There is no drift, tremor or rebound. Romberg is negative. Reflexes are 2+ throughout. Babinski: Toes are flexor bilaterally. Fine motor skills and coordination: intact with normal finger taps, normal hand movements, normal rapid alternating patting, normal foot taps and normal foot agility.  Cerebellar testing: No dysmetria or intention tremor on finger to nose testing. Heel to shin is unremarkable bilaterally. There is no truncal or gait ataxia.  Sensory exam: intact to light touch, pinprick, vibration, temperature sense in the upper and lower extremities.  Gait, station and balance: She stands easily. No veering to one side is noted. No leaning to one side is noted. Posture is age-appropriate and stance is narrow based. Gait shows normal stride length and normal pace. No problems turning are noted. She turns en bloc. Tandem walk is unremarkable.   Assessment and plan:  In summary, Linda Glenn is a very pleasant 40 y.o.-year old female with an underlying medical history of hypertension, hyperlipidemia, PCOS, prediabetes, and obesity, whose history and physical exam are indeed concerning for obstructive sleep apnea (OSA). I had a long chat with the patient about my findings and the diagnosis of OSA, its prognosis and treatment options. We talked about medical treatments, surgical interventions and non-pharmacological approaches. I explained in particular the risks and ramifications of untreated moderate to severe OSA, especially with  respect to developing cardiovascular disease down the Road, including congestive heart failure, difficult to treat hypertension, cardiac arrhythmias, or stroke. Even type 2 diabetes has, in part, been linked to untreated OSA. Symptoms of untreated OSA include daytime sleepiness, memory problems, mood irritability and mood disorder such as depression and anxiety, lack of energy, as well as recurrent headaches, especially morning headaches. We talked about smoking (marijuana) cessation and trying to maintain a healthy lifestyle in general, as well as the importance of weight control. I encouraged the patient to eat healthy, exercise daily and keep well hydrated, to keep a scheduled bedtime and wake time routine, to not skip any meals and eat healthy snacks in between meals. I advised the patient not to drive when feeling sleepy. I recommended the following at this time: sleep  study with potential positive airway pressure titration. (We will score hypopneas at 3% and split the sleep study into diagnostic and treatment portion, if the estimated. 2 hour AHI is >15/h).   I explained the sleep test procedure to the patient and also outlined possible surgical and non-surgical treatment options of OSA, including the use of a custom-made dental device (which would require a referral to a specialist dentist or oral surgeon), upper airway surgical options, such as pillar implants, radiofrequency surgery, tongue base surgery, and UPPP (which would involve a referral to an ENT surgeon). Rarely, jaw surgery such as mandibular advancement may be considered.  I also explained the CPAP treatment option to the patient, who indicated that she would be willing to try CPAP if the need arises. I explained the importance of being compliant with PAP treatment, not only for insurance purposes but primarily to improve Her symptoms, and for the patient's long term health benefit, including to reduce Her cardiovascular risks. I answered  all her questions today and the patient was in agreement. I would like to see her back after the sleep study is completed and encouraged her to call with any interim questions, concerns, problems or updates.   Thank you very much for allowing me to participate in the care of this nice patient. If I can be of any further assistance to you please do not hesitate to call me at [phone].  Sincerely,   Linda Foley, MD, PhD

## 2015-08-23 NOTE — Patient Instructions (Addendum)

## 2015-09-16 ENCOUNTER — Ambulatory Visit (INDEPENDENT_AMBULATORY_CARE_PROVIDER_SITE_OTHER): Payer: BLUE CROSS/BLUE SHIELD | Admitting: Neurology

## 2015-09-16 DIAGNOSIS — G4733 Obstructive sleep apnea (adult) (pediatric): Secondary | ICD-10-CM

## 2015-09-16 DIAGNOSIS — G4710 Hypersomnia, unspecified: Secondary | ICD-10-CM | POA: Diagnosis not present

## 2015-09-16 DIAGNOSIS — G4720 Circadian rhythm sleep disorder, unspecified type: Secondary | ICD-10-CM

## 2015-09-16 DIAGNOSIS — G479 Sleep disorder, unspecified: Secondary | ICD-10-CM

## 2015-09-19 ENCOUNTER — Telehealth: Payer: Self-pay | Admitting: Neurology

## 2015-09-19 NOTE — Telephone Encounter (Signed)
Patient referred by NP, Yevonne Pax, seen by me on 08/23/15, diagnostic PSG on 09/16/15.   Please call and notify the patient that the recent sleep study did show overall mild OSA (obstructive sleep apnea). Please inform patient that I would like to go over the details of the study and treatment options during a follow up appointment. Arrange a followup appointment. Also, route or fax report to PCP and referring MD, if other than PCP.  Once you have spoken to patient, you can close this encounter.   Thanks,  Huston Foley, MD, PhD [facility])

## 2015-09-19 NOTE — Telephone Encounter (Signed)
I spoke to patient and she is aware of results and recommendations. We were able to make f/u appt. I will send copy of report to PCP.

## 2015-10-03 ENCOUNTER — Ambulatory Visit (INDEPENDENT_AMBULATORY_CARE_PROVIDER_SITE_OTHER): Payer: BLUE CROSS/BLUE SHIELD | Admitting: Neurology

## 2015-10-03 ENCOUNTER — Encounter: Payer: Self-pay | Admitting: Neurology

## 2015-10-03 VITALS — BP 156/97 | HR 82 | Wt 203.5 lb

## 2015-10-03 DIAGNOSIS — R351 Nocturia: Secondary | ICD-10-CM | POA: Diagnosis not present

## 2015-10-03 DIAGNOSIS — G4733 Obstructive sleep apnea (adult) (pediatric): Secondary | ICD-10-CM

## 2015-10-03 DIAGNOSIS — R51 Headache: Secondary | ICD-10-CM | POA: Diagnosis not present

## 2015-10-03 DIAGNOSIS — G479 Sleep disorder, unspecified: Secondary | ICD-10-CM | POA: Diagnosis not present

## 2015-10-03 DIAGNOSIS — R519 Headache, unspecified: Secondary | ICD-10-CM

## 2015-10-03 NOTE — Progress Notes (Signed)
Subjective:    Patient ID: Linda Glenn is a 40 y.o. female.  HPI     Interim history:   Linda Glenn is a 40 year old right-handed woman with an underlying medical history of hypertension, hyperlipidemia, PCOS, prediabetes, and obesity, who presents for follow-up consultation of her sleep disturbance, after her recent sleep study. The patient is unaccompanied today. I first met her on 08/23/2015 at the request of her primary care physician, at which time she reported snoring and excessive daytime somnolence as well as witnessed apneic pauses while asleep. I invited her back for a sleep study. She had a baseline sleep study on 09/16/2015. I went over her test results with her in detail today. Sleep efficiency was 73.7% with a latency to sleep of 59.5 minutes and wake after sleep onset of 67.5 minutes with mild to moderate sleep fragmentation noted. She had an increased percentage of light stage sleep, absence of slow-wave sleep and a REM percentage of near normal at 19.4% with a mildly prolonged REM latency of 130 minutes. She had no significant PLMS, EKG or EEG changes. Mild intermittent snoring was noted. Total AHI was mildly elevated at 6.7 per hour, rising to 16.4 per hour during REM sleep. Baseline oxygen saturation was 94%, nadir was 84%. She had absence of supine REM sleep.   Today, 10/03/2015: She reports doing okay, has a mild HA, HAs are often triggered by wearing glasses, any kind of glasses. Sadly, her sister passed away after being on life support for about a week the same day the patient had her sleep study. Nevertheless, she felt she had a good sleep study and good experience. No new Sx. Has seen an allergy specialist, had a CT head/sinuses, which per her was neg.   Previously:   08/23/2015: She reports snoring and excessive daytime somnolence, as well as breathing pauses while asleep, per mother. She tried her mother's CPAP machine. She also reports numbness intermittently on her  left side. I reviewed your office note from 07/13/2015, which you kindly included. She had recent blood work 3 office including vitamin D level, vitamin B12, CBC with differential, CMP, and TSH. Will request test results from your office. Hemoglobin A1c in your office was 6.2 at the time. Random fingerstick blood sugar was 92 the time.Per her verbal report, other blood test results were fine with the exception of low vitamin D. She has been taking prescription strength vitamin D. The patient is single and has no children. She works at [employer]. She drinks alcohol infrequently, smokes marijuana occasionally, drinks caffeine daily in the form of coffee one cup per day and 2 bottles of soda, 20 ounces each. She goes to bed around midnight. She usually sleeps at most 4 hours in a row. She has to get up to use bathroom once or twice per night. She denies restless leg symptoms but is a restless sleeper. She has no pets. She watches TV in bed and TV tends to stay on all night. She has occasional morning headaches. Her maternal grandfather also had obstructive sleep apnea. The patient has woken up with a sense of gasping and panic. She has had intermittent tingling in her left forearm and left foot. Symptoms are mild and not particularly bothersome. She also has had chest wall discomfort on the left lateral chest wall and was told that she had most likely costochondritis.   of note, she saw ENT last week. She had a head CT, could be a maxillofacial CT, reports that  CT scan was unremarkable. She was started on Singulair and Xyzal. She quit smoking some 3 years ago.  Her Past Medical History Is Significant For: Past Medical History:  Diagnosis Date  . Diabetes mellitus without complication ([place])   . Headache   . Hyperlipidemia   . Hypertension   . Polycystic ovarian syndrome   . Sleep apnea   . Vitamin D deficiency     Her Past Surgical History Is Significant For: Past Surgical History:  Procedure  Laterality Date  . DILATION AND CURETTAGE OF UTERUS    . WISDOM TOOTH EXTRACTION      Her Family History Is Significant For: Family History  Problem Relation Age of Onset  . Thyroid disease Mother   . Heart disease Mother   . Fibromyalgia Mother   . Sleep apnea Mother   . Stroke Mother   . Heart failure Mother   . Prostate cancer Father     Her Social History Is Significant For: Social History   Social History  . Marital status: Single    Spouse name: N/A  . Number of children: 0  . Years of education: college   Occupational History  . [place] History Main Topics  . Smoking status: Former Smoker    Packs/day: 0.30    Years: 10.00    Types: Cigarettes  . Smokeless tobacco: Never Used     Comment: Quit 2013   . Alcohol use 0.0 oz/week  . Drug use:     Types: Marijuana  . Sexual activity: Yes    Birth control/ protection: None   Other Topics Concern  . None   Social History Narrative   Drinks caffeine daily     Her Allergies Are:  Allergies  Allergen Reactions  . Food     Tomato  :   Her Current Medications Are:  Outpatient Encounter Prescriptions as of 10/03/2015  Medication Sig  . levocetirizine (XYZAL) 5 MG tablet   . metFORMIN (GLUCOPHAGE) 500 MG tablet Take 500 mg by mouth daily.  . montelukast (SINGULAIR) 10 MG tablet   . Vitamin D, Ergocalciferol, (DRISDOL) 50000 units CAPS capsule    No facility-administered encounter medications on file as of 10/03/2015.   :  Review of Systems:  Out of a complete 14 point review of systems, all are reviewed and negative with the exception of these symptoms as listed below: Review of Systems  Neurological:       Here to discuss sleep study. Pain in right elbow since sleeping with right arm bent 3-4 weeks ago. Slight headache.    Objective:  Neurologic Exam  Physical Exam Physical Examination:   Vitals:   10/03/15 1606  BP: (!) 156/97  Pulse: 82   Recheck BP was 138/78.    General Examination: The patient is a very pleasant 40 y.o. female in no acute distress. She appears well-developed and well-nourished and very well groomed.   HEENT: Normocephalic, atraumatic, pupils are equal, round and reactive to light and accommodation. Extraocular tracking is good without limitation to gaze excursion or nystagmus noted. Normal smooth pursuit is noted. Hearing is grossly intact. Face is symmetric with normal facial animation and normal facial sensation. Speech is clear with no dysarthria noted. There is no hypophonia. There is no lip, neck/head, jaw or voice tremor. Neck is supple with full range of passive and active motion. There are no carotid bruits on auscultation. Oropharynx exam reveals: mild mouth dryness, good dental hygiene and  moderate airway crowding, due to  small airway entry, and tonsils in place which are 1+ bilaterally, tongue is normal in size, Mallampati is class II. Tongue protrudes centrally and palate elevates symmetrically.   Chest: Clear to auscultation without wheezing, rhonchi or crackles noted.  Heart: S1+S2+0, regular and normal without murmurs, rubs or gallops noted.   Abdomen: Soft, non-tender and non-distended with normal bowel sounds appreciated on auscultation.  Extremities: There is no pitting edema in the distal lower extremities bilaterally. Pedal pulses are intact.  Skin: Warm and dry without trophic changes noted. There are no varicose veins.  Musculoskeletal: exam reveals no obvious joint deformities, tenderness or joint swelling or erythema. She has some soreness at the R elbow.   Neurologically:  Mental status: The patient is awake, alert and oriented in all 4 spheres. Her immediate and remote memory, attention, language skills and fund of knowledge are appropriate. There is no evidence of aphasia, agnosia, apraxia or anomia. Speech is clear with normal prosody and enunciation. Thought process is linear. Mood is normal and affect is  normal.  Cranial nerves II - XII are as described above under HEENT exam. In addition: shoulder shrug is normal with equal shoulder height noted. Motor exam: Normal bulk, strength and tone is noted. There is no drift, tremor or rebound. Romberg is negative. Reflexes are 2+ throughout. Fine motor skills and coordination: intact with normal finger taps, normal hand movements, normal rapid alternating patting, normal foot taps and normal foot agility.  Cerebellar testing: No dysmetria or intention tremor on finger to nose testing. Heel to shin is unremarkable bilaterally. There is no truncal or gait ataxia.  Sensory exam: intact to light touch in the UEs and LEs.  Gait, station and balance: She stands easily. No veering to one side is noted. No leaning to one side is noted. Posture is age-appropriate and stance is narrow based. Gait shows normal stride length and normal pace. No problems turning are noted. Tandem walk is unremarkable.   Assessment and plan:  In summary, Linda Glenn is a very pleasant 40 year old female with an underlying medical history of hypertension, hyperlipidemia, PCOS, prediabetes, and obesity, who presents for follow up of her sleep disturbance, including snoring, apneas, nocturia and AM HAs. Her exam is nonfocal. She had a sleep study on 09/16/2015 which overall showed mild sleep disordered breathing, some sleep disruption, this, in the context of recently losing her sister the same day. I talked to her about her test results in detail today from her baseline sleep study. She is encouraged to try to lose weight, weight has been stable for the past few years. Blood pressure upon recheck was improved as well. I suggested we proceed with AutoPap treatment to see if she feels better, if her sleep improves, her morning headaches improve and her nocturia improves. She would be willing to proceed. To that end, I placed an order for AutoPap therapy and we will send this to her DME  company. I will see her back for recheck in 3 months to see how things are going and how she feels. She is encouraged to call with any interim questions or concerns or problems. I answered all her questions today and she was in agreement.   I spent 25 minutes in total face-to-face time with the patient, more than 50% of which was spent in counseling and coordination of care, reviewing test results, reviewing medication and discussing or reviewing the diagnosis of OSA, its prognosis and treatment options.

## 2015-10-03 NOTE — Patient Instructions (Signed)
We will set you up at home with a so called autoPAP machine for treatment of your overall mild obstructive sleep apnea.  I will see you back in about 3 months to see how things are going and how you feel.

## 2015-10-04 NOTE — Patient Instructions (Signed)
Referral for cpap sent to [facility].

## 2015-12-04 ENCOUNTER — Encounter: Payer: Self-pay | Admitting: Neurology

## 2016-01-07 ENCOUNTER — Ambulatory Visit: Payer: BLUE CROSS/BLUE SHIELD | Admitting: Neurology
